# Patient Record
Sex: Male | Born: 1990 | Race: Black or African American | Hispanic: No | Marital: Single | Smoking: Current some day smoker
Health system: Southern US, Community
[De-identification: ages and names within clinical notes are randomized; demographics above are authoritative.]

## PROBLEM LIST (undated history)

## (undated) DIAGNOSIS — G4730 Sleep apnea, unspecified: Secondary | ICD-10-CM

---
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
## 2012-07-07 ENCOUNTER — Emergency Department (HOSPITAL_COMMUNITY): Payer: 59

## 2012-07-07 ENCOUNTER — Encounter (HOSPITAL_COMMUNITY): Payer: Self-pay | Admitting: Emergency Medicine

## 2012-07-07 ENCOUNTER — Emergency Department (HOSPITAL_COMMUNITY)
Admission: EM | Admit: 2012-07-07 | Discharge: 2012-07-07 | Disposition: A | Discharge status: Home / Self Care | Payer: 59 | Attending: Emergency Medicine | Admitting: Emergency Medicine

## 2012-07-07 DIAGNOSIS — T148XXA Other injury of unspecified body region, initial encounter: Secondary | ICD-10-CM

## 2012-07-07 DIAGNOSIS — Y929 Unspecified place or not applicable: Secondary | ICD-10-CM | POA: Insufficient documentation

## 2012-07-07 DIAGNOSIS — Y939 Activity, unspecified: Secondary | ICD-10-CM | POA: Insufficient documentation

## 2012-07-07 DIAGNOSIS — W3309XA Accidental discharge of other larger firearm, initial encounter: Secondary | ICD-10-CM | POA: Insufficient documentation

## 2012-07-07 DIAGNOSIS — L923 Foreign body granuloma of the skin and subcutaneous tissue: Secondary | ICD-10-CM | POA: Insufficient documentation

## 2012-07-07 MED ORDER — SULFAMETHOXAZOLE-TRIMETHOPRIM 800-160 MG PO TABS: 1.0000 | ORAL_TABLET | Freq: Two times a day (BID) | ORAL | Status: DC

## 2012-07-07 NOTE — ED Notes (Signed)
Patient transported to X-ray 
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
## 2012-07-10 NOTE — ED Provider Notes (Signed)
Medical screening examination/treatment/procedure(s) were performed by non-physician practitioner and as supervising physician I was immediately available for consultation/collaboration.  Derk Doubek T Rudi Knippenberg, MD 07/10/12 1638 

## 2017-12-23 ENCOUNTER — Other Ambulatory Visit: Payer: Self-pay

## 2017-12-23 ENCOUNTER — Emergency Department (HOSPITAL_COMMUNITY)
Admission: EM | Admit: 2017-12-23 | Discharge: 2017-12-23 | Disposition: A | Discharge status: Home / Self Care | Payer: 59 | Attending: Emergency Medicine | Admitting: Emergency Medicine

## 2017-12-23 ENCOUNTER — Encounter (HOSPITAL_COMMUNITY): Payer: Self-pay | Admitting: Emergency Medicine

## 2017-12-23 ENCOUNTER — Emergency Department (HOSPITAL_COMMUNITY): Payer: 59

## 2017-12-23 DIAGNOSIS — Y9389 Activity, other specified: Secondary | ICD-10-CM | POA: Diagnosis not present

## 2017-12-23 DIAGNOSIS — Y999 Unspecified external cause status: Secondary | ICD-10-CM | POA: Diagnosis not present

## 2017-12-23 DIAGNOSIS — R51 Headache: Secondary | ICD-10-CM | POA: Diagnosis present

## 2017-12-23 DIAGNOSIS — Y929 Unspecified place or not applicable: Secondary | ICD-10-CM | POA: Insufficient documentation

## 2017-12-23 DIAGNOSIS — S0083XA Contusion of other part of head, initial encounter: Secondary | ICD-10-CM | POA: Diagnosis not present

## 2017-12-23 MED ORDER — ACETAMINOPHEN 500 MG PO TABS
500.0000 mg | ORAL_TABLET | Freq: Four times a day (QID) | ORAL | 0 refills | Status: AC | PRN
Start: 2017-12-23 — End: ?

## 2017-12-23 MED ORDER — IBUPROFEN 600 MG PO TABS
600.0000 mg | ORAL_TABLET | Freq: Four times a day (QID) | ORAL | 0 refills | Status: AC | PRN
Start: 2017-12-23 — End: ?

## 2017-12-23 MED ORDER — METHOCARBAMOL 500 MG PO TABS
500.0000 mg | ORAL_TABLET | Freq: Two times a day (BID) | ORAL | 0 refills | Status: AC
Start: 2017-12-23 — End: ?
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
## 2017-12-23 NOTE — ED Notes (Signed)
Patient Alert and oriented to baseline. Stable and ambulatory to baseline. Patient verbalized understanding of the discharge instructions.  Patient belongings were taken by the patient.   
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
## 2021-03-16 ENCOUNTER — Other Ambulatory Visit: Payer: Self-pay

## 2021-03-16 ENCOUNTER — Ambulatory Visit
Admission: RE | Admit: 2021-03-16 | Discharge: 2021-03-16 | Disposition: A | Discharge status: Home / Self Care | Payer: 59 | Source: Ambulatory Visit | Attending: Emergency Medicine | Admitting: Emergency Medicine

## 2021-03-16 VITALS — BP 127/84 | HR 83 | Temp 97.7°F | Resp 18

## 2021-03-16 DIAGNOSIS — R1011 Right upper quadrant pain: Secondary | ICD-10-CM

## 2021-03-16 MED ORDER — DICYCLOMINE HCL 20 MG PO TABS
20.0000 mg | ORAL_TABLET | Freq: Three times a day (TID) | ORAL | 0 refills | Status: AC
Start: 2021-03-16 — End: ?

## 2021-03-16 MED ORDER — OMEPRAZOLE 20 MG PO CPDR
20.0000 mg | DELAYED_RELEASE_CAPSULE | Freq: Two times a day (BID) | ORAL | 0 refills | Status: AC
Start: 2021-03-16 — End: ?

## 2021-03-16 MED ORDER — POLYETHYLENE GLYCOL 3350 17 G PO PACK: 17.0000 g | PACK | Freq: Every day | ORAL | 0 refills | Status: DC

## 2021-03-16 MED ORDER — FAMOTIDINE 20 MG PO TABS
20.0000 mg | ORAL_TABLET | Freq: Two times a day (BID) | ORAL | 0 refills | Status: AC
Start: 2021-03-16 — End: ?

## 2021-03-16 MED ORDER — DOCUSATE SODIUM 100 MG PO CAPS
100.0000 mg | ORAL_CAPSULE | Freq: Two times a day (BID) | ORAL | 0 refills | Status: AC
Start: 2021-03-16 — End: ?

## 2021-03-16 NOTE — ED Triage Notes (Signed)
Pt presents with abdominal pain and cramping and constipation xs 4 days. Last BM 4 days ago.

## 2021-03-16 NOTE — ED Provider Notes (Signed)
[facility]    CSN: [id number] Arrival date & time: 03/16/21  2979      History   Chief Complaint Chief Complaint  Patient presents with   Abdominal Pain    HPI Jason Rose is a 30 y.o. male presenting today for evaluation of abdominal pain and dark stools.  Reports symptoms began 4 days ago initially with more indigestion heartburn sensation in chest and upper abdomen, but this has improved and noticed turned into a more right-sided abdominal pain over the past 2 days.  Reports that it is severe in nature, reports poor sleep over the past 2 days due to discomfort.  Pain is constant.  Reports also last normal bowel movement was approximately 4 days ago.  Had 2 small bowel movements that were dark last night and this morning.  Denies any change in pain with passing bowels.  Did take Pepto-Bismol for the past 2 days.  Denies history of GI problems or prior abdominal surgeries.  Symptoms initially began after attending a cookout where he did drink alcohol, reports that he does not have regular alcohol use.  Denies regular use of NSAIDs.  Does report tobacco use.  HPI  History reviewed. No pertinent past medical history.  There are no problems to display for this patient.   History reviewed. No pertinent surgical history.     Home Medications    Prior to Admission medications   Medication Sig Start Date End Date Taking? Authorizing Provider  dicyclomine (BENTYL) 20 MG tablet Take 1 tablet (20 mg total) by mouth 4 (four) times daily -  before meals and at bedtime. 03/16/21  Yes Gurshan Settlemire C, PA-C  docusate sodium (COLACE) 100 MG capsule Take 1 capsule (100 mg total) by mouth every 12 (twelve) hours. 03/16/21  Yes Loise Esguerra C, PA-C  famotidine (PEPCID) 20 MG tablet Take 1 tablet (20 mg total) by mouth 2 (two) times daily. 03/16/21  Yes Maeci Kalbfleisch C, PA-C  omeprazole (PRILOSEC) 20 MG capsule Take 1 capsule (20 mg total) by mouth 2 (two) times daily before a  meal. 03/16/21  Yes Katrina Daddona C, PA-C  polyethylene glycol (MIRALAX / GLYCOLAX) 17 g packet Take 17 g by mouth daily. 03/16/21  Yes Otha Monical C, PA-C  acetaminophen (TYLENOL) 500 MG tablet Take 1 tablet (500 mg total) by mouth every 6 (six) hours as needed. 12/23/17   Law, Waylan Boga, PA-C  ibuprofen (ADVIL,MOTRIN) 600 MG tablet Take 1 tablet (600 mg total) by mouth every 6 (six) hours as needed. 12/23/17   Law, Waylan Boga, PA-C  methocarbamol (ROBAXIN) 500 MG tablet Take 1 tablet (500 mg total) by mouth 2 (two) times daily. 12/23/17   Emi Holes, PA-C    Family History History reviewed. No pertinent family history.  Social History Social History   Tobacco Use   Smoking status: Never   Smokeless tobacco: Never  Substance Use Topics   Alcohol use: Yes   Drug use: Never     Allergies   Aspirin   Review of Systems Review of Systems  Constitutional:  Negative for fever.  HENT:  Negative for sore throat.   Respiratory:  Negative for shortness of breath.   Cardiovascular:  Negative for chest pain.  Gastrointestinal:  Positive for abdominal pain. Negative for nausea and vomiting.  Genitourinary:  Negative for difficulty urinating, dysuria, frequency, penile discharge, penile pain, penile swelling, scrotal swelling and testicular pain.  Skin:  Negative for rash.  Neurological:  Negative for  dizziness, light-headedness and headaches.    Physical Exam Triage Vital Signs ED Triage Vitals  Enc Vitals Group     BP      Pulse      Resp      Temp      Temp src      SpO2      Weight      Height      Head Circumference      Peak Flow      Pain Score      Pain Loc      Pain Edu?      Excl. in GC?    No data found.  Updated Vital Signs BP 127/84 (BP Location: Right Arm)   Pulse 83   Temp 97.7 F (36.5 C) (Oral)   Resp 18   SpO2 97%   Visual Acuity Right Eye Distance:   Left Eye Distance:   Bilateral Distance:    Right Eye Near:   Left Eye Near:     Bilateral Near:     Physical Exam Vitals and nursing note reviewed.  Constitutional:      Appearance: He is well-developed.     Comments: No acute distress  HENT:     Head: Normocephalic and atraumatic.     Nose: Nose normal.     Mouth/Throat:     Comments: Oral mucosa pink and moist, no tonsillar enlargement or exudate. Posterior pharynx patent and nonerythematous, no uvula deviation or swelling. Normal phonation.  Eyes:     Conjunctiva/sclera: Conjunctivae normal.  Cardiovascular:     Rate and Rhythm: Normal rate and regular rhythm.  Pulmonary:     Effort: Pulmonary effort is normal. No respiratory distress.     Comments: Breathing comfortably at rest, CTABL, no wheezing, rales or other adventitious sounds auscultated  Abdominal:     General: There is no distension.     Comments: Soft, nondistended, tender to palpation to right upper and lower quadrant, nontender to left upper and lower quadrant, negative rebound, negative Rovsing, negative McBurney's  Musculoskeletal:        General: Normal range of motion.     Cervical back: Neck supple.  Skin:    General: Skin is warm and dry.  Neurological:     Mental Status: He is alert and oriented to person, place, and time.     UC Treatments / Results  Labs (all labs ordered are listed, but only abnormal results are displayed) Labs Reviewed  CBC WITH DIFFERENTIAL/PLATELET  COMPREHENSIVE METABOLIC PANEL  LIPASE    EKG   Radiology No results found.  Procedures Procedures (including critical care time)  Medications Ordered in UC Medications - No data to display  Initial Impression / Assessment and Plan / UC Course  I have reviewed the triage vital signs and the nursing notes.  Pertinent labs & imaging results that were available during my care of the patient were reviewed by me and considered in my medical decision making (see chart for details).    Right-sided abdominal pain-symptoms sound initially as possible  GERD/gastritis, but now more suggestive of underlying constipation.  Given reported severity of symptoms will obtain blood work to check LFTs, will lipase and white count.  Initiating treatment on PPI and H2 RA, along with MiraLAX/Colace combination to help her move bowels.  Suspect dark stools likely related to Pepto-Bismol use, advised to monitor for return to normal color with stopping this.  Patient to go to emergency room if developing  worsening abdominal pain.  Discussed strict return precautions. Patient verbalized understanding and is agreeable with plan.  Final Clinical Impressions(s) / UC Diagnoses   Final diagnoses:  Right upper quadrant abdominal pain     Discharge Instructions      - Blood work pending to further evaluate liver/gallbladder/pancreas - Begin MiraLAX powder daily along with Colace twice daily to help with underlying constipation - Begin omeprazole twice daily for prevention of underlying acid/indigestion/gastritis - Pepcid twice daily to further help with acid/indigestion - May trial Bentyl before meals/bedtime to help with pain and discomfort - If pain not improving with moving bowels, above medicines please go to emergency room     ED Prescriptions     Medication Sig Dispense Auth. Provider   polyethylene glycol (MIRALAX / GLYCOLAX) 17 g packet Take 17 g by mouth daily. 14 each Kaylinn Dedic C, PA-C   docusate sodium (COLACE) 100 MG capsule Take 1 capsule (100 mg total) by mouth every 12 (twelve) hours. 30 capsule Aliayah Tyer C, PA-C   omeprazole (PRILOSEC) 20 MG capsule Take 1 capsule (20 mg total) by mouth 2 (two) times daily before a meal. 20 capsule Dequandre Cordova C, PA-C   famotidine (PEPCID) 20 MG tablet Take 1 tablet (20 mg total) by mouth 2 (two) times daily. 30 tablet Alva Broxson C, PA-C   dicyclomine (BENTYL) 20 MG tablet Take 1 tablet (20 mg total) by mouth 4 (four) times daily -  before meals and at bedtime. 20 tablet Endya Austin, [place]  C, PA-C      PDMP not reviewed this encounter.   Lew Dawes, PA-C 03/16/21 1025

## 2021-03-16 NOTE — Discharge Instructions (Addendum)
-   Blood work pending to further evaluate liver/gallbladder/pancreas - Begin MiraLAX powder daily along with Colace twice daily to help with underlying constipation - Begin omeprazole twice daily for prevention of underlying acid/indigestion/gastritis - Pepcid twice daily to further help with acid/indigestion - May trial Bentyl before meals/bedtime to help with pain and discomfort - If pain not improving with moving bowels, above medicines please go to emergency room

## 2021-03-17 LAB — COMPREHENSIVE METABOLIC PANEL
ALT: 27 IU/L (ref 0–44)
AST: 18 IU/L (ref 0–40)
Albumin/Globulin Ratio: 1.5 (ref 1.2–2.2)
Albumin: 4.3 g/dL (ref 4.1–5.2)
Alkaline Phosphatase: 70 IU/L (ref 44–121)
BUN/Creatinine Ratio: 7 — ABNORMAL LOW (ref 9–20)
BUN: 7 mg/dL (ref 6–20)
Bilirubin Total: 0.5 mg/dL (ref 0.0–1.2)
CO2: 23 mmol/L (ref 20–29)
Calcium: 9.3 mg/dL (ref 8.7–10.2)
Chloride: 104 mmol/L (ref 96–106)
Creatinine, Ser: 0.96 mg/dL (ref 0.76–1.27)
Globulin, Total: 2.9 g/dL (ref 1.5–4.5)
Glucose: 92 mg/dL (ref 65–99)
Potassium: 4.4 mmol/L (ref 3.5–5.2)
Sodium: 141 mmol/L (ref 134–144)
Total Protein: 7.2 g/dL (ref 6.0–8.5)
eGFR: 110 mL/min/{1.73_m2} (ref >59)

## 2021-03-17 LAB — CBC WITH DIFFERENTIAL/PLATELET
Basophils Absolute: 0.1 10*3/uL (ref 0.0–0.2)
Basos: 0 %
EOS (ABSOLUTE): 0.1 10*3/uL (ref 0.0–0.4)
Eos: 1 %
Hematocrit: 46.8 % (ref 37.5–51.0)
Hemoglobin: 16.1 g/dL (ref 13.0–17.7)
Immature Grans (Abs): 0 10*3/uL (ref 0.0–0.1)
Immature Granulocytes: 0 %
Lymphocytes Absolute: 2.7 10*3/uL (ref 0.7–3.1)
Lymphs: 24 %
MCH: 30.5 pg (ref 26.6–33.0)
MCHC: 34.4 g/dL (ref 31.5–35.7)
MCV: 89 fL (ref 79–97)
Monocytes Absolute: 1 10*3/uL — ABNORMAL HIGH (ref 0.1–0.9)
Monocytes: 9 %
Neutrophils Absolute: 7.6 10*3/uL — ABNORMAL HIGH (ref 1.4–7.0)
Neutrophils: 66 %
Platelets: 262 10*3/uL (ref 150–450)
RBC: 5.28 x10E6/uL (ref 4.14–5.80)
RDW: 12.7 % (ref 11.6–15.4)
WBC: 11.5 10*3/uL — ABNORMAL HIGH (ref 3.4–10.8)

## 2021-03-17 LAB — LIPASE: Lipase: 18 U/L (ref 13–78)

## 2021-03-20 ENCOUNTER — Emergency Department (HOSPITAL_COMMUNITY): Payer: Self-pay

## 2021-03-20 ENCOUNTER — Emergency Department (HOSPITAL_COMMUNITY)
Admission: EM | Admit: 2021-03-20 | Discharge: 2021-03-20 | Disposition: A | Discharge status: Home / Self Care | Payer: Self-pay | Attending: Emergency Medicine | Admitting: Emergency Medicine

## 2021-03-20 ENCOUNTER — Encounter (HOSPITAL_COMMUNITY): Payer: Self-pay

## 2021-03-20 ENCOUNTER — Other Ambulatory Visit: Payer: Self-pay

## 2021-03-20 DIAGNOSIS — N3000 Acute cystitis without hematuria: Secondary | ICD-10-CM | POA: Insufficient documentation

## 2021-03-20 DIAGNOSIS — K5900 Constipation, unspecified: Secondary | ICD-10-CM | POA: Insufficient documentation

## 2021-03-20 DIAGNOSIS — D72829 Elevated white blood cell count, unspecified: Secondary | ICD-10-CM | POA: Insufficient documentation

## 2021-03-20 HISTORY — DX: Sleep apnea, unspecified: G47.30

## 2021-03-20 LAB — COMPREHENSIVE METABOLIC PANEL
ALT: 24 U/L (ref 0–44)
AST: 19 U/L (ref 15–41)
Albumin: 4 g/dL (ref 3.5–5.0)
Alkaline Phosphatase: 56 U/L (ref 38–126)
Anion gap: 9 (ref 5–15)
BUN: 7 mg/dL (ref 6–20)
CO2: 25 mmol/L (ref 22–32)
Calcium: 9.2 mg/dL (ref 8.9–10.3)
Chloride: 101 mmol/L (ref 98–111)
Creatinine, Ser: 0.98 mg/dL (ref 0.61–1.24)
GFR, Estimated: >60 mL/min (ref >60)
Glucose, Bld: 115 mg/dL — ABNORMAL HIGH (ref 70–99)
Potassium: 4 mmol/L (ref 3.5–5.1)
Sodium: 135 mmol/L (ref 135–145)
Total Bilirubin: 0.7 mg/dL (ref 0.3–1.2)
Total Protein: 7.9 g/dL (ref 6.5–8.1)

## 2021-03-20 LAB — CBC WITH DIFFERENTIAL/PLATELET
Abs Immature Granulocytes: 0.05 10*3/uL (ref 0.00–0.07)
Basophils Absolute: 0 10*3/uL (ref 0.0–0.1)
Basophils Relative: 0 %
Eosinophils Absolute: 0.1 10*3/uL (ref 0.0–0.5)
Eosinophils Relative: 1 %
HCT: 45.7 % (ref 39.0–52.0)
Hemoglobin: 16 g/dL (ref 13.0–17.0)
Immature Granulocytes: 0 %
Lymphocytes Relative: 23 %
Lymphs Abs: 3 10*3/uL (ref 0.7–4.0)
MCH: 31 pg (ref 26.0–34.0)
MCHC: 35 g/dL (ref 30.0–36.0)
MCV: 88.6 fL (ref 80.0–100.0)
Monocytes Absolute: 1.1 10*3/uL — ABNORMAL HIGH (ref 0.1–1.0)
Monocytes Relative: 9 %
Neutro Abs: 8.6 10*3/uL — ABNORMAL HIGH (ref 1.7–7.7)
Neutrophils Relative %: 67 %
Platelets: 287 10*3/uL (ref 150–400)
RBC: 5.16 MIL/uL (ref 4.22–5.81)
RDW: 12.1 % (ref 11.5–15.5)
WBC: 12.9 10*3/uL — ABNORMAL HIGH (ref 4.0–10.5)
nRBC: 0 % (ref 0.0–0.2)

## 2021-03-20 LAB — LIPASE, BLOOD: Lipase: 23 U/L (ref 11–51)

## 2021-03-20 LAB — URINALYSIS, ROUTINE W REFLEX MICROSCOPIC
Bilirubin Urine: NEGATIVE
Glucose, UA: NEGATIVE mg/dL
Hgb urine dipstick: NEGATIVE
Ketones, ur: NEGATIVE mg/dL
Nitrite: NEGATIVE
Protein, ur: NEGATIVE mg/dL
Specific Gravity, Urine: 1.013 (ref 1.005–1.030)
pH: 6 (ref 5.0–8.0)

## 2021-03-20 MED ORDER — CEPHALEXIN 500 MG PO CAPS
500.0000 mg | ORAL_CAPSULE | Freq: Once | ORAL | Status: AC
  Administered 2021-03-20: 500 mg via ORAL
  Filled 2021-03-20: qty 1

## 2021-03-20 MED ORDER — CEPHALEXIN 500 MG PO CAPS: 500.0000 mg | ORAL_CAPSULE | Freq: Four times a day (QID) | ORAL | 0 refills | Status: DC

## 2021-03-20 MED ORDER — ONDANSETRON HCL 4 MG/2ML IJ SOLN
4.0000 mg | Freq: Once | INTRAMUSCULAR | Status: AC
  Administered 2021-03-20: 4 mg via INTRAVENOUS
  Filled 2021-03-20: qty 2

## 2021-03-20 MED ORDER — BISACODYL 5 MG PO TBEC: DELAYED_RELEASE_TABLET | ORAL | 0 refills | Status: DC

## 2021-03-20 MED ORDER — SODIUM CHLORIDE 0.9 % IV SOLN
1.0000 g | Freq: Once | INTRAVENOUS | Status: AC
  Administered 2021-03-20: 1 g via INTRAVENOUS
  Filled 2021-03-20: qty 10

## 2021-03-20 MED ORDER — CEPHALEXIN 500 MG PO CAPS: ORAL_CAPSULE | ORAL | 0 refills | Status: DC

## 2021-03-20 MED ORDER — LACTATED RINGERS IV BOLUS
1000.0000 mL | Freq: Once | INTRAVENOUS | Status: AC
  Administered 2021-03-20: 1000 mL via INTRAVENOUS

## 2021-03-20 MED ORDER — MORPHINE SULFATE (PF) 4 MG/ML IV SOLN
4.0000 mg | Freq: Once | INTRAVENOUS | Status: AC
  Administered 2021-03-20: 4 mg via INTRAVENOUS
  Filled 2021-03-20: qty 1

## 2021-03-20 MED ORDER — IOHEXOL 350 MG/ML SOLN
80.0000 mL | Freq: Once | INTRAVENOUS | Status: AC | PRN
  Administered 2021-03-20: 80 mL via INTRAVENOUS

## 2021-03-20 NOTE — ED Provider Notes (Signed)
[facility]-EMERGENCY DEPT Provider Note   CSN: [id number] Arrival date & time: 03/20/21  2376     History Chief Complaint  Patient presents with   Abdominal Pain    Jason Rose is a 30 y.o. male.  PMH of sleep apnea.  Patient states that he started having central abdominal pain 7 days ago. It is now localized to the RLQ and continues to worsen. He was seen by urgent care a couple of days ago and basic labs showed a leukocytosis. They sent him home with a prescription for miralax for presumed constipation. He has had one subjective fever. He has not had a BM for three days. He has associated nausea without vomiting. States he felt a "pop" in the RLQ this morning which prompted him to come to the ED. He denies chest pain, vomiting, diarrhea, dyspnea.    Abdominal Pain Associated symptoms: chills, constipation, fever and nausea   Associated symptoms: no chest pain, no diarrhea, no dysuria, no shortness of breath and no vomiting       Past Medical History:  Diagnosis Date   Sleep apnea     There are no problems to display for this patient.   History reviewed. No pertinent surgical history.     History reviewed. No pertinent family history.  Social History   Tobacco Use   Smoking status: Never   Smokeless tobacco: Never  Substance Use Topics   Alcohol use: Yes   Drug use: Never    Home Medications Prior to Admission medications   Medication Sig Start Date End Date Taking? Authorizing Provider  bisacodyl (DULCOLAX) 5 MG EC tablet Take two dulcolax tablets at one time. Pair with 8.3 oz bottle of miralax mixed in with drink of choice. Gatorade is okay. 03/20/21  Yes Treylan Mcclintock, Finis Bud, PA-C  acetaminophen (TYLENOL) 500 MG tablet Take 1 tablet (500 mg total) by mouth every 6 (six) hours as needed. 12/23/17   Law, Waylan Boga, PA-C  cephALEXin (KEFLEX) 500 MG capsule Take 500 mg every 6 hours for 5 days. 03/20/21   Christropher Gintz, Finis Bud, PA-C  dicyclomine  (BENTYL) 20 MG tablet Take 1 tablet (20 mg total) by mouth 4 (four) times daily -  before meals and at bedtime. 03/16/21   Wieters, Hallie C, PA-C  docusate sodium (COLACE) 100 MG capsule Take 1 capsule (100 mg total) by mouth every 12 (twelve) hours. 03/16/21   Wieters, Hallie C, PA-C  famotidine (PEPCID) 20 MG tablet Take 1 tablet (20 mg total) by mouth 2 (two) times daily. 03/16/21   Wieters, Hallie C, PA-C  ibuprofen (ADVIL,MOTRIN) 600 MG tablet Take 1 tablet (600 mg total) by mouth every 6 (six) hours as needed. 12/23/17   Law, Waylan Boga, PA-C  methocarbamol (ROBAXIN) 500 MG tablet Take 1 tablet (500 mg total) by mouth 2 (two) times daily. 12/23/17   Law, Waylan Boga, PA-C  omeprazole (PRILOSEC) 20 MG capsule Take 1 capsule (20 mg total) by mouth 2 (two) times daily before a meal. 03/16/21   Wieters, Hallie C, PA-C  polyethylene glycol (MIRALAX / GLYCOLAX) 17 g packet Take 17 g by mouth daily. 03/16/21   Wieters, Hallie C, PA-C    Allergies    Aspirin  Review of Systems   Review of Systems  Constitutional:  Positive for chills and fever.  Respiratory:  Negative for shortness of breath.   Cardiovascular:  Negative for chest pain.  Gastrointestinal:  Positive for abdominal pain, constipation and nausea. Negative for  abdominal distention, blood in stool, diarrhea and vomiting.  Genitourinary:  Negative for difficulty urinating, dysuria and flank pain.  Neurological:  Negative for syncope and headaches.  All other systems reviewed and are negative.  Physical Exam Updated Vital Signs BP (!) 152/84 (BP Location: Right Arm)   Pulse 67   Temp 98.7 F (37.1 C) (Oral)   Resp 15   SpO2 98%   Physical Exam Vitals and nursing note reviewed.  Constitutional:      General: He is not in acute distress.    Appearance: He is obese.  HENT:     Head: Normocephalic and atraumatic.  Cardiovascular:     Rate and Rhythm: Normal rate and regular rhythm.     Heart sounds: Normal heart sounds. No murmur  heard.   No friction rub. No gallop.  Pulmonary:     Effort: Pulmonary effort is normal. No respiratory distress.     Breath sounds: Normal breath sounds. No wheezing, rhonchi or rales.  Abdominal:     General: Abdomen is flat. Bowel sounds are normal. There is no distension.     Palpations: Abdomen is soft.     Tenderness: There is abdominal tenderness in the right lower quadrant. There is no guarding or rebound. Positive signs include McBurney's sign.  Skin:    General: Skin is warm and dry.  Neurological:     General: No focal deficit present.     Mental Status: He is alert and oriented to person, place, and time.  Psychiatric:        Mood and Affect: Mood normal.        Behavior: Behavior normal.    ED Results / Procedures / Treatments   Labs (all labs ordered are listed, but only abnormal results are displayed) Labs Reviewed  CBC WITH DIFFERENTIAL/PLATELET - Abnormal; Notable for the following components:      Result Value   WBC 12.9 (*)    Neutro Abs 8.6 (*)    Monocytes Absolute 1.1 (*)    All other components within normal limits  COMPREHENSIVE METABOLIC PANEL - Abnormal; Notable for the following components:   Glucose, Bld 115 (*)    All other components within normal limits  URINALYSIS, ROUTINE W REFLEX MICROSCOPIC - Abnormal; Notable for the following components:   APPearance HAZY (*)    Leukocytes,Ua LARGE (*)    Bacteria, UA RARE (*)    All other components within normal limits  LIPASE, BLOOD    EKG None  Radiology CT Abdomen Pelvis W Contrast  Result Date: 03/20/2021 CLINICAL DATA:  Right lower quadrant abdominal pain for 1 week. EXAM: CT ABDOMEN AND PELVIS WITH CONTRAST TECHNIQUE: Multidetector CT imaging of the abdomen and pelvis was performed using the standard protocol following bolus administration of intravenous contrast. CONTRAST:  55mL OMNIPAQUE IOHEXOL 350 MG/ML SOLN COMPARISON:  None. FINDINGS: Lower chest: No acute abnormality. Hepatobiliary: No  focal liver abnormality is seen. No gallstones, gallbladder wall thickening, or biliary dilatation. Pancreas: Unremarkable. No pancreatic ductal dilatation or surrounding inflammatory changes. Spleen: Normal in size without focal abnormality. Adrenals/Urinary Tract: Adrenal glands are unremarkable. Kidneys are normal, without renal calculi, focal lesion, or hydronephrosis. The urinary bladder wall appears diffusely thickened, measuring up to 6 mm. Stomach/Bowel: Stomach is within normal limits. Appendix appears normal. No evidence of bowel wall thickening, distention, or inflammatory changes. Vascular/Lymphatic: No significant vascular findings are present. No enlarged abdominal or pelvic lymph nodes. Reproductive: Prostate is unremarkable. Other: No abdominal wall hernia or abnormality.  No abdominopelvic ascites. Musculoskeletal: No acute or significant osseous findings. IMPRESSION: Diffuse urinary bladder wall thickening is nonspecific but may reflect cystitis. Electronically Signed   By: Romona Curls M.D.   On: 03/20/2021 09:56    Procedures Procedures   Medications Ordered in ED Medications  lactated ringers bolus 1,000 mL (0 mLs Intravenous Stopped 03/20/21 1025)  morphine 4 MG/ML injection 4 mg (4 mg Intravenous Given 03/20/21 0837)  ondansetron (ZOFRAN) injection 4 mg (4 mg Intravenous Given 03/20/21 0837)  iohexol (OMNIPAQUE) 350 MG/ML injection 80 mL (80 mLs Intravenous Contrast Given 03/20/21 0933)  cefTRIAXone (ROCEPHIN) 1 g in sodium chloride 0.9 % 100 mL IVPB (0 g Intravenous Stopped 03/20/21 1116)  cephALEXin (KEFLEX) capsule 500 mg (500 mg Oral Given 03/20/21 1116)    ED Course  I have reviewed the triage vital signs and the nursing notes.  Pertinent labs & imaging results that were available during my care of the patient were reviewed by me and considered in my medical decision making (see chart for details).  Clinical Course as of 03/20/21 1334  Sat Mar 20, 2021  5188 VS reviewed.  Stable. Labs and CT abdomen pelvis ordered for suspected appendicitis. [GL]  0946 Mild leukocytosis noted on labs. UA with rare bacteria but positive for leukocytes. Lipase within normal limits.   [GL]  [id number] Awaiting final imaging results [GL]  1000 CT final read negative for appendicitis but notable for diffuse urinary bladder wall thickening is nonspecific but may reflect cystitis.   [GL]    Clinical Course User Index [GL] Jaquawn Saffran, Finis Bud, PA-C   MDM Rules/Calculators/A&P                          Patients initial presentation was concerning for appendicitis. CT scan ruled this out. UA, CT scan results, and presentation is consistent with pyelonephritis. Patient was given 1 dose of Rocephin in ED. He was also given first dose of Keflex. Sent home with Keflex prescription and bowel protocol instructions for constipation.  Final Clinical Impression(s) / ED Diagnoses Final diagnoses:  Acute cystitis without hematuria  Constipation, unspecified constipation type    Rx / DC Orders ED Discharge Orders          Ordered    cephALEXin (KEFLEX) 500 MG capsule  4 times daily,   Status:  Discontinued        03/20/21 1026    bisacodyl (DULCOLAX) 5 MG EC tablet        03/20/21 1029    cephALEXin (KEFLEX) 500 MG capsule        03/20/21 1046             Adeli Frost, Finis Bud, PA-C 03/20/21 1334    Ernie Avena, MD 03/21/21 1301

## 2021-03-20 NOTE — Discharge Instructions (Addendum)
You  have been seen in the ED for abdominal pain and diagnosed with a urinary tract infection/kidney infection.   For constipation relief,  - take two dulcolax tablets at one time - Mix 8.3 oz bottle of Miralax (can be bought OTC) with 64 oz of juice or Gatorade - drink within a 4 hour period  For your UTI, please take your antibiotic (Keflex) as prescribed.   Return if symptoms worsen

## 2021-03-20 NOTE — ED Triage Notes (Addendum)
Pt arrived via POV, c/o RLQ abd pain x1 week. States he was seen, told for concern for appendicitis vs constipation. States he was trying to have a bowel mvmt this morning, and felt a "pop" in RLQ, states pain worsening suddenly. Has been febrile at home. No fever in triage.

## 2022-02-04 IMAGING — CT CT ABD-PELV W/ CM
2 of 4 series · 17 of 46 positions shown, 19 images · IV contrast (omnipaque)
Comparison: None.

CLINICAL DATA: Right lower quadrant abdominal pain for 1 week.

EXAM:
CT ABDOMEN AND PELVIS WITH CONTRAST
TECHNIQUE: Multidetector CT imaging of the abdomen and pelvis was performed
using the standard protocol following bolus administration of
intravenous contrast.
CONTRAST:  80mL OMNIPAQUE IOHEXOL 350 MG/ML SOLN

[Series 2: axial st · axial · 0.98mm/px · z∈[-641,-136]mm · 14 of 117 slices shown, 16 images]
[im 8/117  soft-tissue]
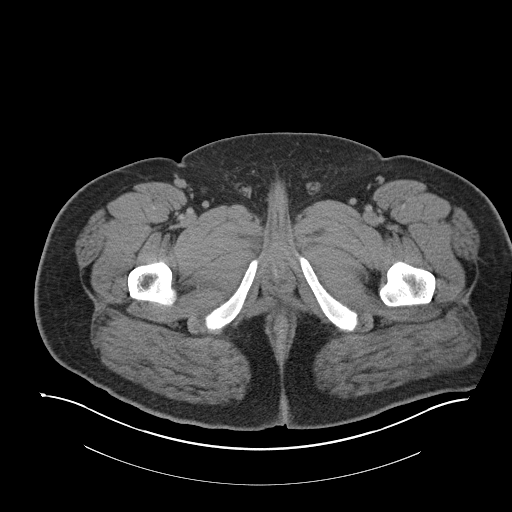
[im 8/117  bone]
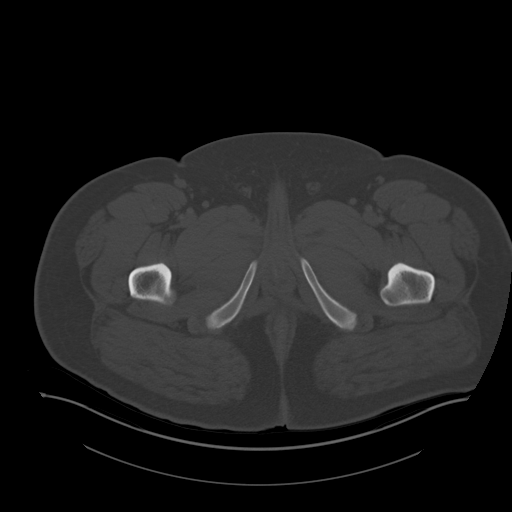
[im 15/117  soft-tissue]
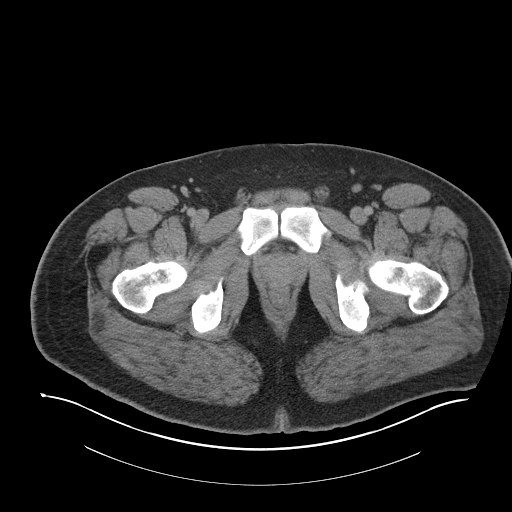
[im 22/117  soft-tissue]
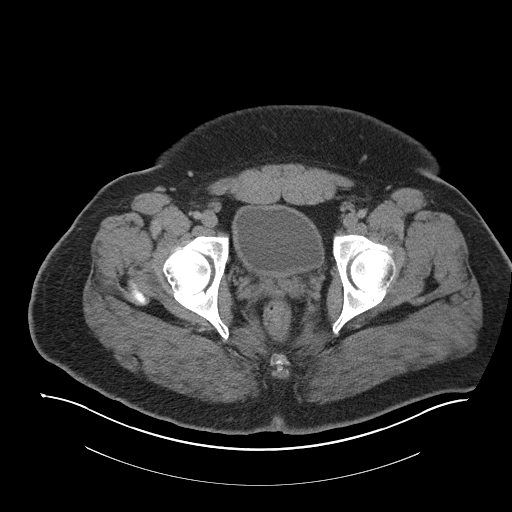
[im 30/117  soft-tissue]
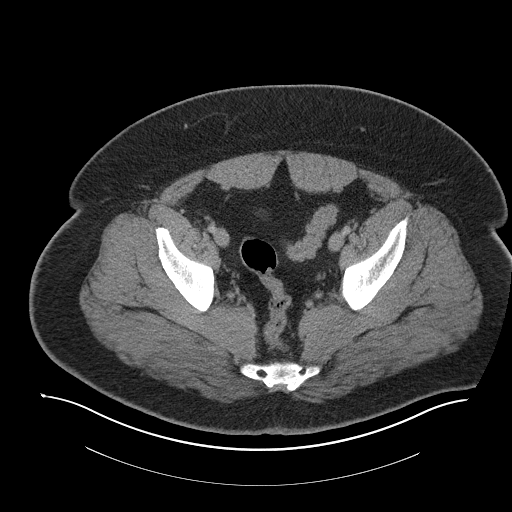
[im 37/117  soft-tissue]
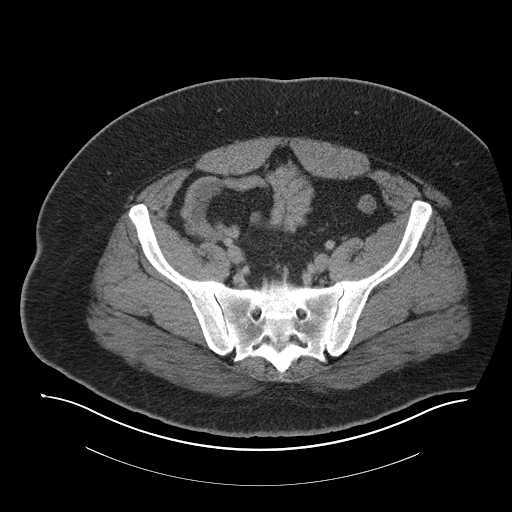
[im 44/117  soft-tissue]
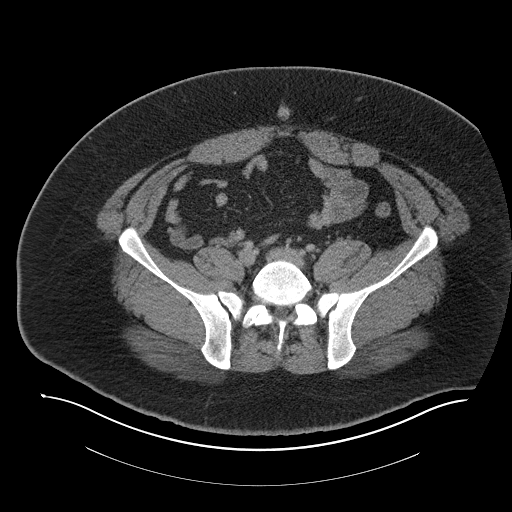
[im 51/117  soft-tissue]
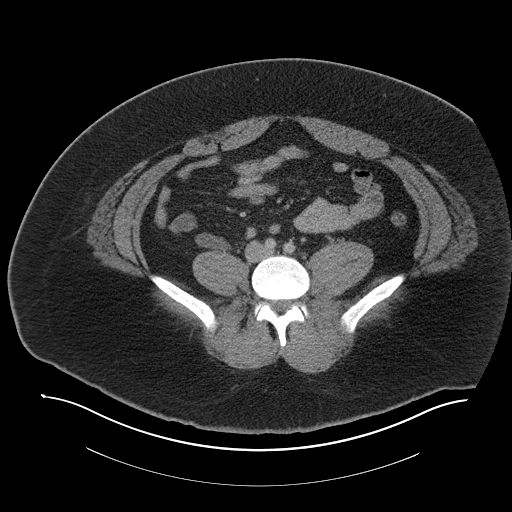
[im 66/117  soft-tissue]
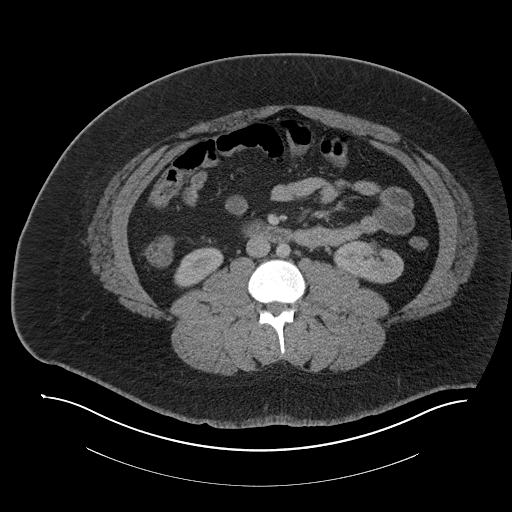
[im 73/117  soft-tissue]
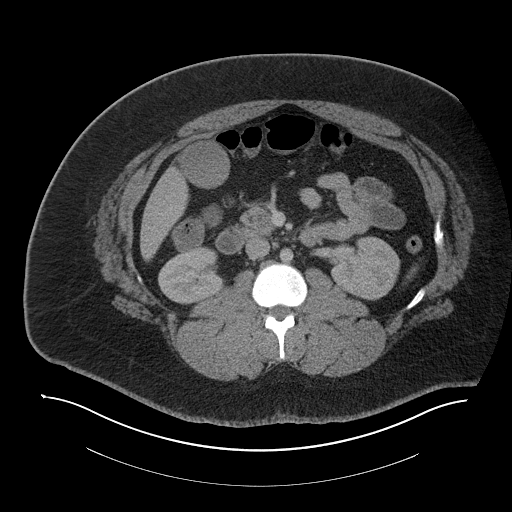
[im 73/117  bone]
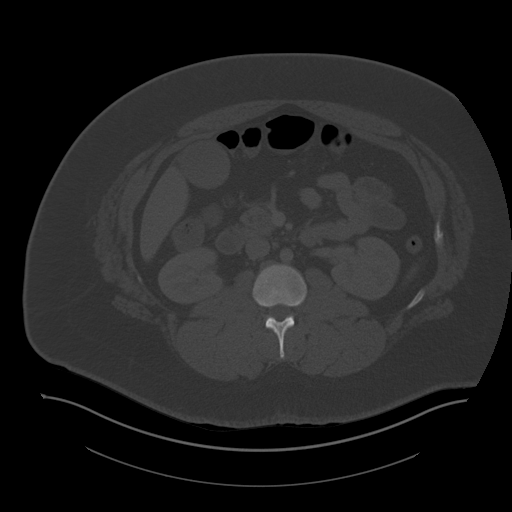
[im 80/117  soft-tissue]
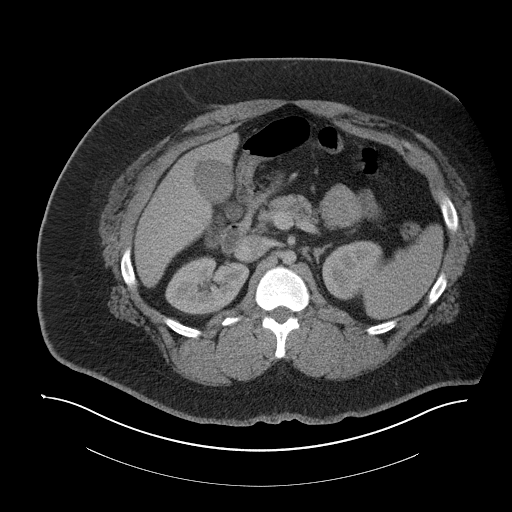
[im 88/117  soft-tissue]
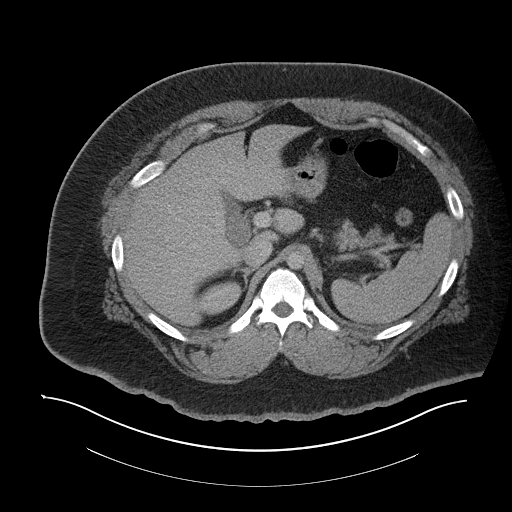
[im 95/117  soft-tissue]
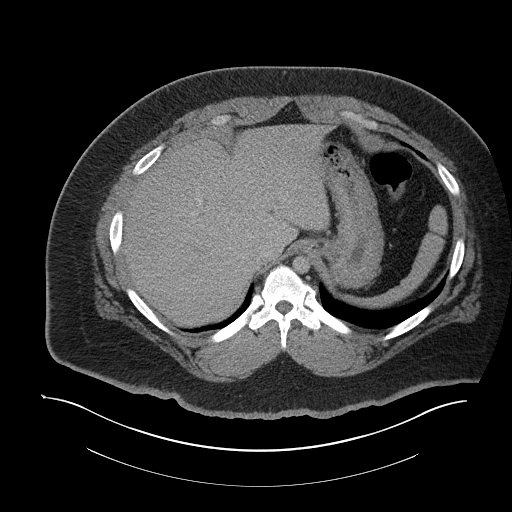
[im 102/117  soft-tissue]
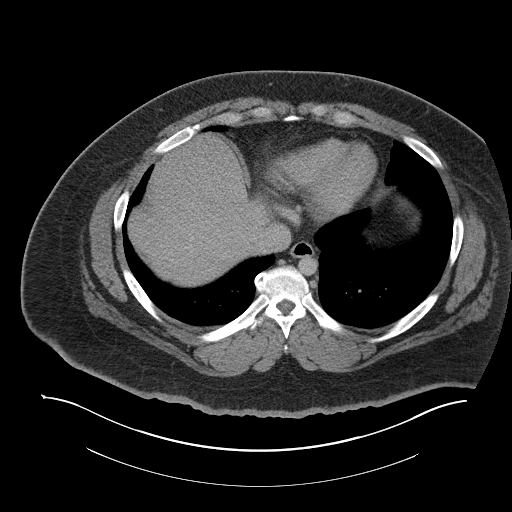
[im 109/117  soft-tissue]
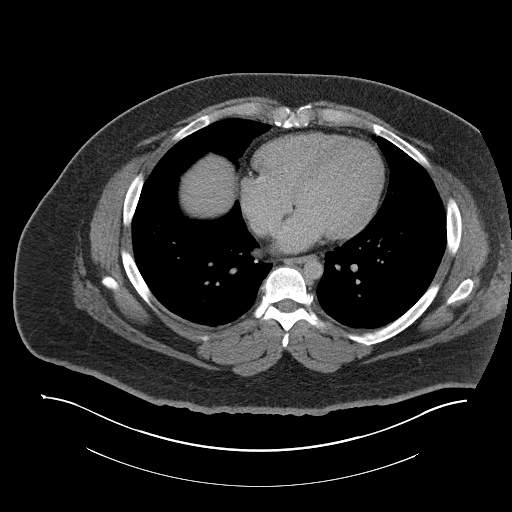

[Series 5: coronal st · coronal · 1.11mm/px · 3 of 181 slices shown]
[im 61/181  soft-tissue]
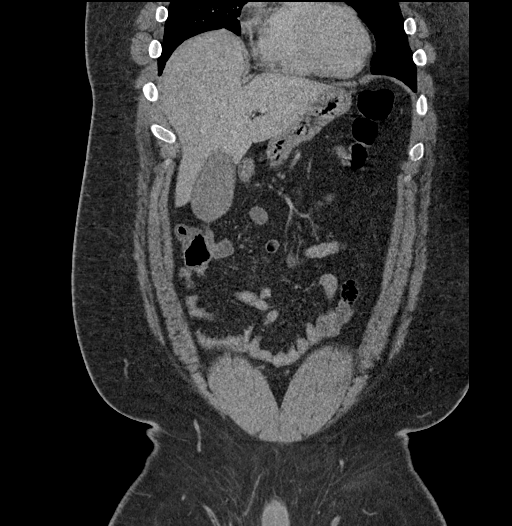
[im 81/181  soft-tissue]
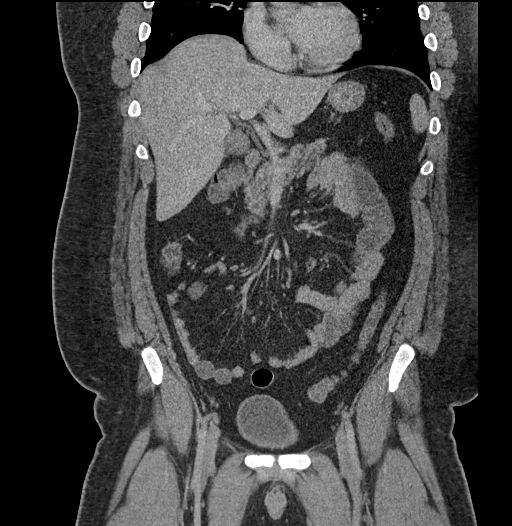
[im 101/181  soft-tissue]
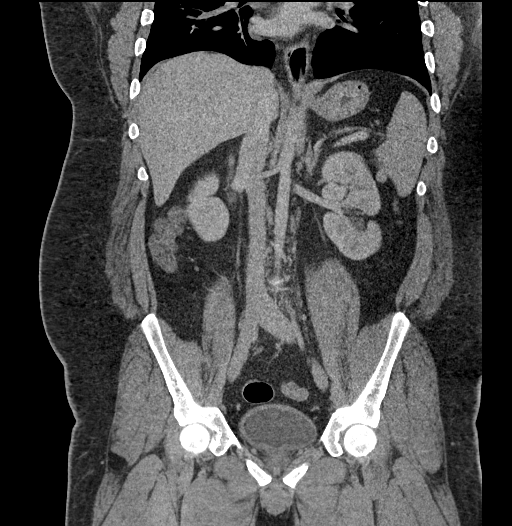

[17 of 46 positions shown; findings below may reference images not displayed]

FINDINGS: Lower chest: No acute abnormality.

Hepatobiliary: No focal liver abnormality is seen. No gallstones,
gallbladder wall thickening, or biliary dilatation.

Pancreas: Unremarkable. No pancreatic ductal dilatation or
surrounding inflammatory changes.

Spleen: Normal in size without focal abnormality.

Adrenals/Urinary Tract: Adrenal glands are unremarkable. Kidneys are
normal, without renal calculi, focal lesion, or hydronephrosis. The
urinary bladder wall appears diffusely thickened, measuring up to 6
mm.

Stomach/Bowel: Stomach is within normal limits. Appendix appears
normal. No evidence of bowel wall thickening, distention, or
inflammatory changes.

Vascular/Lymphatic: No significant vascular findings are present. No
enlarged abdominal or pelvic lymph nodes.

Reproductive: Prostate is unremarkable.

Other: No abdominal wall hernia or abnormality. No abdominopelvic
ascites.

Musculoskeletal: No acute or significant osseous findings.
IMPRESSION: Diffuse urinary bladder wall thickening is nonspecific but may
reflect cystitis.

## 2024-05-28 ENCOUNTER — Ambulatory Visit
Admission: RE | Admit: 2024-05-28 | Discharge: 2024-05-28 | Disposition: A | Discharge status: Home / Self Care | Payer: Self-pay | Source: Ambulatory Visit | Attending: Emergency Medicine | Admitting: Emergency Medicine

## 2024-05-28 VITALS — BP 142/84 | HR 72 | Temp 97.9°F | Resp 20 | Ht >= 80 in | Wt >= 6400 oz

## 2024-05-28 DIAGNOSIS — B9689 Other specified bacterial agents as the cause of diseases classified elsewhere: Secondary | ICD-10-CM | POA: Diagnosis not present

## 2024-05-28 DIAGNOSIS — J019 Acute sinusitis, unspecified: Secondary | ICD-10-CM

## 2024-05-28 DIAGNOSIS — H65193 Other acute nonsuppurative otitis media, bilateral: Secondary | ICD-10-CM | POA: Diagnosis not present

## 2024-05-28 MED ORDER — AMOXICILLIN-POT CLAVULANATE 875-125 MG PO TABS: 1.0000 | ORAL_TABLET | Freq: Two times a day (BID) | ORAL | 0 refills | Status: AC

## 2024-05-28 NOTE — ED Triage Notes (Signed)
 I was sick a few weeks back and I believe I caught a sinus infection. 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 - Entered by patient  Pt presents c/o head pressure, facial pressure, sinus headaches x 30 days. Pt states,  I done the lil drops you put in your ear and it didn't work. I'm not coughing a whole lot and the cough I do have is probably from smoking. No runny nose just congestion.

## 2024-05-28 NOTE — Discharge Instructions (Signed)
 I am treating you for ear infection, and sinus infection.  Please take medication Augmentin as prescribed. Take with food to avoid upset stomach. Finish the full course - you should not have any leftover! It may take 2-3 days to start working  Keep drinking lots of fluids Please return if needed

## 2024-05-28 NOTE — ED Provider Notes (Signed)
 [facility]    CSN: [id number] Arrival date & time: 05/28/24  0915      History   Chief Complaint Chief Complaint  Patient presents with   Influenza    I was sick a few weeks back and I believe I caught a sinus infection. 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 - Entered by patient    HPI Jason Rose is a 33 y.o. male.  Here with nasal congestion and sinus pressure for the last month A few days ago he started losing hearing in right ear, and more recently the left ear. Tried OTC ear drops and decongestant.  No fevers A little cough every now and then Denies shortness of breath, chest tightness   Current smoker   Past Medical History:  Diagnosis Date   Sleep apnea     There are no active problems to display for this patient.   History reviewed. No pertinent surgical history.     Home Medications    Prior to Admission medications   Medication Sig Start Date End Date Taking? Authorizing Provider  amoxicillin-clavulanate (AUGMENTIN) 875-125 MG tablet Take 1 tablet by mouth every 12 (twelve) hours for 7 days. 05/28/24 06/04/24 Yes Jayland Null, Asberry, PA-C  acetaminophen  (TYLENOL ) 500 MG tablet Take 1 tablet (500 mg total) by mouth every 6 (six) hours as needed. 12/23/17   Law, Lorane HERO, PA-C  bisacodyl  (DULCOLAX) 5 MG EC tablet Take two dulcolax tablets at one time. Pair with 8.3 oz bottle of miralax  mixed in with drink of choice. Gatorade is okay. 03/20/21   Loeffler, Ronnald BROCKS, PA-C  dicyclomine  (BENTYL ) 20 MG tablet Take 1 tablet (20 mg total) by mouth 4 (four) times daily -  before meals and at bedtime. 03/16/21   Wieters, Hallie C, PA-C  docusate sodium  (COLACE) 100 MG capsule Take 1 capsule (100 mg total) by mouth every 12 (twelve) hours. 03/16/21   Wieters, Hallie C, PA-C  famotidine  (PEPCID ) 20 MG tablet Take 1 tablet (20 mg total) by mouth 2 (two) times daily. 03/16/21   Wieters, Hallie C, PA-C  ibuprofen  (ADVIL ,MOTRIN ) 600 MG tablet Take 1  tablet (600 mg total) by mouth every 6 (six) hours as needed. 12/23/17   Law, Alexandra M, PA-C  methocarbamol  (ROBAXIN ) 500 MG tablet Take 1 tablet (500 mg total) by mouth 2 (two) times daily. 12/23/17   Law, Lorane HERO, PA-C  omeprazole  (PRILOSEC) 20 MG capsule Take 1 capsule (20 mg total) by mouth 2 (two) times daily before a meal. 03/16/21   Wieters, Hallie C, PA-C  polyethylene glycol (MIRALAX  / GLYCOLAX ) 17 g packet Take 17 g by mouth daily. 03/16/21   Wieters, Hallie C, PA-C    Family History History reviewed. No pertinent family history.  Social History Social History   Tobacco Use   Smoking status: Never    Passive exposure: Never   Smokeless tobacco: Never  Vaping Use   Vaping status: Never Used  Substance Use Topics   Alcohol use: Yes   Drug use: Never     Allergies   Aspirin   Review of Systems Review of Systems As per HPI  Physical Exam Triage Vital Signs ED Triage Vitals  Encounter Vitals Group     BP 05/28/24 0934 (!) 140/100     Girls Systolic BP Percentile --      Girls Diastolic BP Percentile --      Boys Systolic BP Percentile --      Boys  Diastolic BP Percentile --      Pulse Rate 05/28/24 0934 72     Resp 05/28/24 0934 (!) 22     Temp 05/28/24 0934 97.9 F (36.6 C)     Temp Source 05/28/24 0934 Oral     SpO2 05/28/24 0934 94 %     Weight 05/28/24 0932 (!) 400 lb (181.4 kg)     Height 05/28/24 0932 6' 8 (2.032 m)     Head Circumference --      Peak Flow --      Pain Score 05/28/24 0931 2     Pain Loc --      Pain Education --      Exclude from Growth Chart --    No data found.  Updated Vital Signs BP (!) 142/84 (BP Location: Right Arm)   Pulse 72   Temp 97.9 F (36.6 C) (Oral)   Resp 20   Ht 6' 8 (2.032 m)   Wt (!) 400 lb (181.4 kg)   SpO2 96%   BMI 43.94 kg/m   Physical Exam Vitals and nursing note reviewed.  Constitutional:      General: He is not in acute distress.    Appearance: Normal appearance.  HENT:     Right Ear:  Tympanic membrane is injected and bulging.     Left Ear: Tympanic membrane is injected.     Nose: Congestion present.     Mouth/Throat:     Mouth: Mucous membranes are moist.     Pharynx: Oropharynx is clear. No posterior oropharyngeal erythema.     Tonsils: 1+ on the right. 1+ on the left.     Comments: Tonsil stones noted. No erythema or exudate. Normal phonation, tolerating secretions  Cardiovascular:     Rate and Rhythm: Normal rate and regular rhythm.     Pulses: Normal pulses.     Heart sounds: Normal heart sounds.  Pulmonary:     Effort: Pulmonary effort is normal.     Breath sounds: Normal breath sounds.  Abdominal:     Palpations: Abdomen is soft.  Musculoskeletal:     Cervical back: No rigidity.  Lymphadenopathy:     Cervical: No cervical adenopathy.  Neurological:     Mental Status: He is alert and oriented to person, place, and time.      UC Treatments / Results  Labs (all labs ordered are listed, but only abnormal results are displayed) Labs Reviewed - No data to display  EKG   Radiology No results found.  Procedures Procedures (including critical care time)  Medications Ordered in UC Medications - No data to display  Initial Impression / Assessment and Plan / UC Course  I have reviewed the triage vital signs and the nursing notes.  Pertinent labs & imaging results that were available during my care of the patient were reviewed by me and considered in my medical decision making (see chart for details).  Afebrile, clear lungs. BP improved on recheck. Acute bacterial sinusitis, and bilateral otitis media Augmentin BID x 7 days Advised other supportive care, increase fluids, trial of nasal spray. Return if needed. Agrees to plan., no questions. Note for work provided  Final Clinical Impressions(s) / UC Diagnoses   Final diagnoses:  Acute bacterial sinusitis  Other non-recurrent acute nonsuppurative otitis media of both ears     Discharge  Instructions      I am treating you for ear infection, and sinus infection.  Please take medication Augmentin as prescribed. Take  with food to avoid upset stomach. Finish the full course - you should not have any leftover! It may take 2-3 days to start working  Keep drinking lots of fluids Please return if needed    ED Prescriptions     Medication Sig Dispense Auth. Provider   amoxicillin-clavulanate (AUGMENTIN) 875-125 MG tablet Take 1 tablet by mouth every 12 (twelve) hours for 7 days. 14 tablet Annarose Ouellet, Asberry, PA-C      PDMP not reviewed this encounter.   Evelean Bigler, Asberry, PA-C 05/28/24 1017

## 2024-06-07 ENCOUNTER — Ambulatory Visit: Payer: Self-pay

## 2024-06-14 ENCOUNTER — Ambulatory Visit: Payer: Self-pay

## 2024-06-24 ENCOUNTER — Ambulatory Visit: Payer: Self-pay

## 2024-06-25 ENCOUNTER — Ambulatory Visit: Payer: Self-pay

## 2024-07-23 ENCOUNTER — Other Ambulatory Visit: Payer: Self-pay

## 2024-07-23 ENCOUNTER — Ambulatory Visit
Admission: RE | Admit: 2024-07-23 | Discharge: 2024-07-23 | Disposition: A | Discharge status: Home / Self Care | Payer: Self-pay

## 2024-07-23 VITALS — BP 158/70 | HR 87 | Temp 98.6°F | Resp 20

## 2024-07-23 DIAGNOSIS — H6993 Unspecified Eustachian tube disorder, bilateral: Secondary | ICD-10-CM

## 2024-07-23 DIAGNOSIS — J0191 Acute recurrent sinusitis, unspecified: Secondary | ICD-10-CM | POA: Diagnosis not present

## 2024-07-23 MED ORDER — PSEUDOEPHEDRINE HCL 30 MG PO TABS: 30.0000 mg | ORAL_TABLET | ORAL | 0 refills | Status: AC | PRN

## 2024-07-23 MED ORDER — PREDNISONE 50 MG PO TABS: ORAL_TABLET | ORAL | 0 refills | Status: AC

## 2024-07-23 MED ORDER — DOXYCYCLINE HYCLATE 100 MG PO CAPS: 100.0000 mg | ORAL_CAPSULE | Freq: Two times a day (BID) | ORAL | 0 refills | Status: AC

## 2024-07-23 NOTE — ED Triage Notes (Signed)
 Sinus and nasal congestion is complaint today.  Patient reports upper respiratory symptoms since October.  Seen 05/28/2024 for symptoms.  Reports having hearing loss in both ears.  Patient does not have a pcp Has had mucinex, tylenol .  Has used ear wax removal drops otc.  Patient is determined it is an infection and antibiotics is needed

## 2024-07-23 NOTE — Discharge Instructions (Addendum)

## 2024-07-23 NOTE — ED Provider Notes (Signed)
 " [facility]    CSN: [id number] Arrival date & time: 07/23/24  1058      History   Chief Complaint Chief Complaint  Patient presents with   Nasal Congestion    Sinus and nasal congestion - Entered by patient    HPI Jason Rose is a 33 y.o. male.   Pt returns after being seen on 10/28.  Patient states that he was diagnosed with a sinus infection and ear infection and prescribed Augmentin  which he states did nothing for symptoms.  Patient states that all of his symptoms have persisted including sinus pressure, sinus headaches, and marked nasal congestion.  Patient states that he has used OTCs nasal sprays for a month with no significant relief.  Patient states that he can no longer hear out of his ears.  States he has been trying to use over-the-counter cold medications without significant relief of symptoms.  The history is provided by the patient.    Past Medical History:  Diagnosis Date   Sleep apnea     There are no active problems to display for this patient.   History reviewed. No pertinent surgical history.     Home Medications    Prior to Admission medications  Medication Sig Start Date End Date Taking? Authorizing Provider  doxycycline  (VIBRAMYCIN ) 100 MG capsule Take 1 capsule (100 mg total) by mouth 2 (two) times daily for 10 days. 07/23/24 08/02/24 Yes Andra Corean BROCKS, PA-C  predniSONE  (DELTASONE ) 50 MG tablet Take 1 tab po daily for 5 days 07/23/24  Yes Andra Corean BROCKS, PA-C  pseudoephedrine  (SUDAFED) 30 MG tablet Take 1 tablet (30 mg total) by mouth every 4 (four) hours as needed for congestion. 07/23/24  Yes Andra Corean BROCKS, PA-C    Family History History reviewed. No pertinent family history.  Social History Social History[1]   Allergies   Aspirin   Review of Systems Review of Systems   Physical Exam Triage Vital Signs ED Triage Vitals  Encounter Vitals Group     BP 07/23/24 1215 (!) 158/70     Girls  Systolic BP Percentile --      Girls Diastolic BP Percentile --      Boys Systolic BP Percentile --      Boys Diastolic BP Percentile --      Pulse Rate 07/23/24 1215 87     Resp 07/23/24 1215 20     Temp 07/23/24 1215 98.6 F (37 C)     Temp Source 07/23/24 1215 Oral     SpO2 07/23/24 1215 96 %     Weight --      Height --      Head Circumference --      Peak Flow --      Pain Score 07/23/24 1212 5     Pain Loc --      Pain Education --      Exclude from Growth Chart --    No data found.  Updated Vital Signs BP (!) 158/70 (BP Location: Left Arm) Comment (BP Location): large cuff on forearm  Pulse 87   Temp 98.6 F (37 C) (Oral)   Resp 20   SpO2 96%   Visual Acuity Right Eye Distance:   Left Eye Distance:   Bilateral Distance:    Right Eye Near:   Left Eye Near:    Bilateral Near:     Physical Exam Vitals and nursing note reviewed.  Constitutional:      General: He is  not in acute distress.    Appearance: Normal appearance. He is not ill-appearing, toxic-appearing or diaphoretic.  HENT:     Right Ear: Tympanic membrane is injected.     Left Ear: Tympanic membrane is injected.     Nose: Congestion (moderately enlarged turbinates) present. No rhinorrhea.     Right Sinus: No maxillary sinus tenderness or frontal sinus tenderness.     Left Sinus: No maxillary sinus tenderness or frontal sinus tenderness.     Comments: No tenderness to palpation of upper lip    Mouth/Throat:     Mouth: Mucous membranes are moist.     Pharynx: Oropharynx is clear. No oropharyngeal exudate or posterior oropharyngeal erythema.  Eyes:     General: No scleral icterus. Cardiovascular:     Rate and Rhythm: Normal rate and regular rhythm.     Heart sounds: Normal heart sounds.  Pulmonary:     Effort: Pulmonary effort is normal. No respiratory distress.     Breath sounds: Normal breath sounds. No wheezing or rhonchi.  Skin:    General: Skin is warm.  Neurological:     Mental Status:  He is alert and oriented to person, place, and time.  Psychiatric:        Mood and Affect: Mood normal.        Behavior: Behavior normal.      UC Treatments / Results  Labs (all labs ordered are listed, but only abnormal results are displayed) Labs Reviewed - No data to display  EKG   Radiology No results found.  Procedures Procedures (including critical care time)  Medications Ordered in UC Medications - No data to display  Initial Impression / Assessment and Plan / UC Course  I have reviewed the triage vital signs and the nursing notes.  Pertinent labs & imaging results that were available during my care of the patient were reviewed by me and considered in my medical decision making (see chart for details).   Final Clinical Impressions(s) / UC Diagnoses   Final diagnoses:  Acute recurrent sinusitis, unspecified location  Dysfunction of both eustachian tubes     Discharge Instructions      You have been diagnosed with a sinus infection today, some are caused by viruses and others are caused by bacteria.  If your symptoms have been going on for less than 7 days it is most likely that you have a viral sinus infection.  Antibiotics will not work for this and it will have to run its course.  Sinus rinses (using a Nettie pot) or saline rinses are helpful as well as pseudoephedrine , and nasal sprays along with ibuprofen  and Tylenol  for pain.  If you have had your symptoms for more than 7 days you most likely have a bacterial infection and will be prescribed antibiotics.  Supportive measures given for viral sinus infections will also be helpful for bacterial infections.  If you are using antibiotics you should start to feel better in 2 to 3 days but it is important that you complete antibiotics in their entirety.     ED Prescriptions     Medication Sig Dispense Auth. Provider   doxycycline  (VIBRAMYCIN ) 100 MG capsule Take 1 capsule (100 mg total) by mouth 2 (two) times  daily for 10 days. 20 capsule Alvey Brockel C, PA-C   predniSONE  (DELTASONE ) 50 MG tablet Take 1 tab po daily for 5 days 5 tablet Andra Krabbe C, PA-C   pseudoephedrine  (SUDAFED) 30 MG tablet Take 1 tablet (30  mg total) by mouth every 4 (four) hours as needed for congestion. 30 tablet Andra Corean BROCKS, PA-C      PDMP not reviewed this encounter.    [1]  Social History Tobacco Use   Smoking status: Some Days    Types: Cigarettes    Passive exposure: Never   Smokeless tobacco: Never  Vaping Use   Vaping status: Never Used  Substance Use Topics   Alcohol use: Yes   Drug use: Never     Andra Corean BROCKS, PA-C 07/23/24 1255  "
